# Patient Record
Sex: Male | Born: 1995 | Race: White | Hispanic: No | Marital: Single
Health system: Southern US, Community
[De-identification: ages and names within clinical notes are randomized; demographics above are authoritative.]

## PROBLEM LIST (undated history)

## (undated) DIAGNOSIS — K219 Gastro-esophageal reflux disease without esophagitis: Secondary | ICD-10-CM

## (undated) DIAGNOSIS — R197 Diarrhea, unspecified: Secondary | ICD-10-CM

## (undated) DIAGNOSIS — R1110 Vomiting, unspecified: Secondary | ICD-10-CM

## (undated) DIAGNOSIS — F32A Depression, unspecified: Secondary | ICD-10-CM

## (undated) DIAGNOSIS — F329 Major depressive disorder, single episode, unspecified: Secondary | ICD-10-CM

## (undated) HISTORY — DX: Gastro-esophageal reflux disease without esophagitis: K21.9

## (undated) HISTORY — DX: Vomiting, unspecified: R11.10

## (undated) HISTORY — DX: Diarrhea, unspecified: R19.7

## (undated) HISTORY — PX: OTHER SURGICAL HISTORY: SHX169

---
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
## 2016-09-30 ENCOUNTER — Encounter (HOSPITAL_COMMUNITY): Payer: Self-pay | Admitting: *Deleted

## 2016-09-30 ENCOUNTER — Emergency Department (HOSPITAL_COMMUNITY)
Admission: EM | Admit: 2016-09-30 | Discharge: 2016-09-30 | Disposition: A | Discharge status: Home / Self Care | Payer: Medicaid Other | Attending: Emergency Medicine | Admitting: Emergency Medicine

## 2016-09-30 DIAGNOSIS — Z7722 Contact with and (suspected) exposure to environmental tobacco smoke (acute) (chronic): Secondary | ICD-10-CM | POA: Insufficient documentation

## 2016-09-30 DIAGNOSIS — R197 Diarrhea, unspecified: Secondary | ICD-10-CM | POA: Insufficient documentation

## 2016-09-30 DIAGNOSIS — R1013 Epigastric pain: Secondary | ICD-10-CM | POA: Diagnosis present

## 2016-09-30 DIAGNOSIS — K219 Gastro-esophageal reflux disease without esophagitis: Secondary | ICD-10-CM | POA: Diagnosis not present

## 2016-09-30 DIAGNOSIS — Z791 Long term (current) use of non-steroidal anti-inflammatories (NSAID): Secondary | ICD-10-CM | POA: Diagnosis not present

## 2016-09-30 HISTORY — DX: Depression, unspecified: F32.A

## 2016-09-30 HISTORY — DX: Major depressive disorder, single episode, unspecified: F32.9

## 2016-09-30 MED ORDER — ONDANSETRON 4 MG PO TBDP
4.0000 mg | ORAL_TABLET | Freq: Once | ORAL | Status: AC
  Administered 2016-09-30: 4 mg via ORAL
  Filled 2016-09-30: qty 1

## 2016-09-30 MED ORDER — LOPERAMIDE HCL 2 MG PO CAPS
2.0000 mg | ORAL_CAPSULE | Freq: Once | ORAL | Status: AC
  Administered 2016-09-30: 2 mg via ORAL
  Filled 2016-09-30: qty 1

## 2016-09-30 MED ORDER — FAMOTIDINE 20 MG PO TABS
20.0000 mg | ORAL_TABLET | Freq: Once | ORAL | Status: AC
  Administered 2016-09-30: 20 mg via ORAL
  Filled 2016-09-30: qty 1

## 2016-09-30 MED ORDER — ONDANSETRON 4 MG PREPACK (~~LOC~~): 1.0000 | ORAL_TABLET | Freq: Three times a day (TID) | ORAL | 0 refills | Status: DC | PRN

## 2016-09-30 MED ORDER — RANITIDINE HCL 150 MG PO TABS: 150.0000 mg | ORAL_TABLET | Freq: Two times a day (BID) | ORAL | 1 refills | Status: AC

## 2016-09-30 NOTE — Discharge Instructions (Signed)
Continue to take zantac. You may take 1 tablet (150mg ) twice per day. Take nausea medication as needed.

## 2016-09-30 NOTE — ED Triage Notes (Signed)
Pt c/o "chest pressure", "belching", headache, lightheadedness, stomach cramps, bloating, nausea diarrhea, and SOB x 2 days. Denies vomiting. Pt also reports near syncope x 2 today.

## 2016-09-30 NOTE — ED Provider Notes (Signed)
[facility] Provider Note   CSN: [id number] Arrival date & time: 09/30/16  1538     History   Chief Complaint Chief Complaint  Patient presents with  . Abdominal Pain    HPI Michael Todd is a 20 y.o. male.  HPI   20 year old male with abdominal pain. Epigastric. Onset about a day and half ago. Burning in quality. Sometimes she'll sit up to his chest. The last day he is also reporting diarrhea and crampy left-sided abdominal pain. No fevers or chills. No sick contacts. No blood in stool. No vomiting. No urinary complaints.  Past Medical History:  Diagnosis Date  . Abdominal pain   . Acid reflux   . Depression   . Diarrhea   . Vomiting     Patient Active Problem List   Diagnosis Date Noted  . Recent weight loss 02/22/2014  . Problem with school attendance 02/22/2014  . Poor dentition 02/22/2014  . Diarrhea 04/07/2011  . Vomiting   . Generalized abdominal pain     Past Surgical History:  Procedure Laterality Date  . right foot surgery     tendon       Home Medications    Prior to Admission medications   Medication Sig Start Date End Date Taking? Authorizing Provider  bismuth subsalicylate (PEPTO BISMOL) 262 MG chewable tablet Chew 524 mg by mouth as needed for indigestion.    Historical Provider, MD  cetirizine (ZYRTEC) 10 MG tablet Take 10 mg by mouth daily.    Historical Provider, MD  famotidine (PEPCID) 40 MG tablet Take 40 mg by mouth daily.    Historical Provider, MD  fluticasone (FLONASE) 50 MCG/ACT nasal spray Place 2 sprays into the nose daily.    Historical Provider, MD  loperamide (IMODIUM A-D) 2 MG tablet Take 2 mg by mouth 4 (four) times daily as needed for diarrhea or loose stools.    Historical Provider, MD  ondansetron (ZOFRAN-ODT) 4 MG disintegrating tablet Take 4 mg by mouth every 8 (eight) hours as needed for nausea.    Historical Provider, MD    Family History No family history on file.  Social History Social History    Substance Use Topics  . Smoking status: Passive Smoke Exposure - Never Smoker  . Smokeless tobacco: Never Used  . Alcohol use No     Allergies   Patient has no known allergies.   Review of Systems Review of Systems  All systems reviewed and negative, other than as noted in HPI.   Physical Exam Updated Vital Signs BP 131/90 (BP Location: Left Arm)   Pulse 98   Temp 98 F (36.7 C) (Oral)   Resp 18   Ht 5\' 11"  (1.803 m)   Wt 170 lb (77.1 kg)   SpO2 100%   BMI 23.71 kg/m   Physical Exam  Constitutional: He appears well-developed and well-nourished. No distress.  HENT:  Head: Normocephalic and atraumatic.  Eyes: Conjunctivae are normal. Right eye exhibits no discharge. Left eye exhibits no discharge.  Neck: Neck supple.  Cardiovascular: Normal rate, regular rhythm and normal heart sounds.  Exam reveals no gallop and no friction rub.   No murmur heard. Pulmonary/Chest: Effort normal and breath sounds normal. No respiratory distress.  Abdominal: Soft. He exhibits no distension. There is no tenderness.  Interval epigastric tenderness without rebound or guarding. No distention.  Musculoskeletal: He exhibits no edema or tenderness.  Neurological: He is alert.  Skin: Skin is warm and dry.  Psychiatric: He has  a normal mood and affect. His behavior is normal. Thought content normal.  Nursing note and vitals reviewed.    ED Treatments / Results  Labs (all labs ordered are listed, but only abnormal results are displayed) Labs Reviewed - No data to display  EKG  EKG Interpretation None       Radiology No results found.  Procedures Procedures (including critical care time)  Medications Ordered in ED Medications - No data to display   Initial Impression / Assessment and Plan / ED Course  I have reviewed the triage vital signs and the nursing notes.  Pertinent labs & imaging results that were available during my care of the patient were reviewed by me and  considered in my medical decision making (see chart for details).  Clinical Course    20 year old male with abdominal pain. Epigastric. Reassuring exam. Suspect that this may be reflux. We'll try an H2 blocker. Return precautions discussed.  Final Clinical Impressions(s) / ED Diagnoses   Final diagnoses:  Gastroesophageal reflux disease, esophagitis presence not specified  Diarrhea, unspecified type    New Prescriptions New Prescriptions   No medications on file     Raeford RazorStephen Deeric Cruise, MD 10/08/16 1653

## 2016-09-30 NOTE — ED Notes (Signed)
Called for triage x1. No answer in waiting room.

## 2016-10-01 ENCOUNTER — Emergency Department (HOSPITAL_COMMUNITY): Payer: Medicaid Other

## 2016-10-01 ENCOUNTER — Encounter (HOSPITAL_COMMUNITY): Payer: Self-pay | Admitting: Emergency Medicine

## 2016-10-01 ENCOUNTER — Emergency Department (HOSPITAL_COMMUNITY)
Admission: EM | Admit: 2016-10-01 | Discharge: 2016-10-01 | Disposition: A | Discharge status: Home / Self Care | Payer: Medicaid Other | Attending: Emergency Medicine | Admitting: Emergency Medicine

## 2016-10-01 DIAGNOSIS — R112 Nausea with vomiting, unspecified: Secondary | ICD-10-CM

## 2016-10-01 DIAGNOSIS — R109 Unspecified abdominal pain: Secondary | ICD-10-CM

## 2016-10-01 DIAGNOSIS — Z79899 Other long term (current) drug therapy: Secondary | ICD-10-CM | POA: Diagnosis not present

## 2016-10-01 DIAGNOSIS — Z791 Long term (current) use of non-steroidal anti-inflammatories (NSAID): Secondary | ICD-10-CM | POA: Diagnosis not present

## 2016-10-01 DIAGNOSIS — R1013 Epigastric pain: Secondary | ICD-10-CM | POA: Diagnosis not present

## 2016-10-01 DIAGNOSIS — Z7722 Contact with and (suspected) exposure to environmental tobacco smoke (acute) (chronic): Secondary | ICD-10-CM | POA: Insufficient documentation

## 2016-10-01 DIAGNOSIS — R197 Diarrhea, unspecified: Secondary | ICD-10-CM | POA: Insufficient documentation

## 2016-10-01 DIAGNOSIS — G8929 Other chronic pain: Secondary | ICD-10-CM | POA: Insufficient documentation

## 2016-10-01 DIAGNOSIS — R1010 Upper abdominal pain, unspecified: Secondary | ICD-10-CM | POA: Diagnosis present

## 2016-10-01 LAB — COMPREHENSIVE METABOLIC PANEL
ALK PHOS: 85 U/L (ref 38–126)
ALT: 14 U/L — AB (ref 17–63)
AST: 18 U/L (ref 15–41)
Albumin: 5.2 g/dL — ABNORMAL HIGH (ref 3.5–5.0)
Anion gap: 11 (ref 5–15)
BILIRUBIN TOTAL: 0.4 mg/dL (ref 0.3–1.2)
BUN: 8 mg/dL (ref 6–20)
CALCIUM: 9.6 mg/dL (ref 8.9–10.3)
CO2: 25 mmol/L (ref 22–32)
CREATININE: 1.01 mg/dL (ref 0.61–1.24)
Chloride: 101 mmol/L (ref 101–111)
Glucose, Bld: 107 mg/dL — ABNORMAL HIGH (ref 65–99)
Potassium: 3.7 mmol/L (ref 3.5–5.1)
Sodium: 137 mmol/L (ref 135–145)
TOTAL PROTEIN: 8.6 g/dL — AB (ref 6.5–8.1)

## 2016-10-01 LAB — CBC
HCT: 49.3 % (ref 39.0–52.0)
Hemoglobin: 16.9 g/dL (ref 13.0–17.0)
MCH: 29.1 pg (ref 26.0–34.0)
MCHC: 34.3 g/dL (ref 30.0–36.0)
MCV: 85 fL (ref 78.0–100.0)
PLATELETS: 258 10*3/uL (ref 150–400)
RBC: 5.8 MIL/uL (ref 4.22–5.81)
RDW: 12.5 % (ref 11.5–15.5)
WBC: 12.4 10*3/uL — AB (ref 4.0–10.5)

## 2016-10-01 LAB — URINALYSIS, ROUTINE W REFLEX MICROSCOPIC
GLUCOSE, UA: NEGATIVE mg/dL
KETONES UR: 40 mg/dL — AB
LEUKOCYTES UA: NEGATIVE
Nitrite: NEGATIVE
Specific Gravity, Urine: >1.03 — ABNORMAL HIGH (ref 1.005–1.030)
pH: 6 (ref 5.0–8.0)

## 2016-10-01 LAB — URINE MICROSCOPIC-ADD ON

## 2016-10-01 LAB — LIPASE, BLOOD: Lipase: 16 U/L (ref 11–51)

## 2016-10-01 MED ORDER — PANTOPRAZOLE SODIUM 20 MG PO TBEC: 20.0000 mg | DELAYED_RELEASE_TABLET | Freq: Every day | ORAL | 0 refills | Status: DC

## 2016-10-01 MED ORDER — FAMOTIDINE 20 MG PO TABS
40.0000 mg | ORAL_TABLET | Freq: Once | ORAL | Status: AC
  Administered 2016-10-01: 40 mg via ORAL
  Filled 2016-10-01: qty 2

## 2016-10-01 MED ORDER — DICYCLOMINE HCL 10 MG/ML IM SOLN
20.0000 mg | Freq: Once | INTRAMUSCULAR | Status: AC
  Administered 2016-10-01: 20 mg via INTRAMUSCULAR
  Filled 2016-10-01: qty 2

## 2016-10-01 MED ORDER — DICYCLOMINE HCL 20 MG PO TABS: 20.0000 mg | ORAL_TABLET | Freq: Four times a day (QID) | ORAL | 0 refills | Status: DC | PRN

## 2016-10-01 MED ORDER — GI COCKTAIL ~~LOC~~
30.0000 mL | Freq: Once | ORAL | Status: AC
  Administered 2016-10-01: 30 mL via ORAL
  Filled 2016-10-01: qty 30

## 2016-10-01 MED ORDER — METOCLOPRAMIDE HCL 5 MG/ML IJ SOLN
10.0000 mg | Freq: Once | INTRAMUSCULAR | Status: AC
  Administered 2016-10-01: 10 mg via INTRAMUSCULAR
  Filled 2016-10-01: qty 2

## 2016-10-01 NOTE — ED Provider Notes (Signed)
[facility] Provider Note   CSN: [id number] Arrival date & time: 10/01/16  1533     History   Chief Complaint Chief Complaint  Patient presents with  . Abdominal Pain    HPI Michael Todd is a 20 y.o. male.  HPI  Pt was seen at 1949.  Per pt, c/o gradual onset and persistence of constant acute flair of his chronic upper abd "pain" for the past several days.  Has been associated with his chronic/recurrent symptoms of N/V/D.  Describes the abd pain as "sharp." Denies fevers, no back pain, no rash, no CP/SOB, no black or blood in stools or emesis. The symptoms have been associated with no other complaints. The patient has a significant history of similar symptoms previously, recently being evaluated for this complaint and multiple prior evals for same.  Pt states he has not been back to his Peds GI MD "for a few years."     Past Medical History:  Diagnosis Date  . Abdominal pain   . Acid reflux   . Depression   . Diarrhea   . Vomiting     Patient Active Problem List   Diagnosis Date Noted  . Recent weight loss 02/22/2014  . Problem with school attendance 02/22/2014  . Poor dentition 02/22/2014  . Diarrhea 04/07/2011  . Vomiting   . Generalized abdominal pain     Past Surgical History:  Procedure Laterality Date  . right foot surgery     tendon       Home Medications    Prior to Admission medications   Medication Sig Start Date End Date Taking? Authorizing Provider  amoxicillin-clavulanate (AUGMENTIN) 875-125 MG tablet Take 1 tablet by mouth 2 (two) times daily.   Yes Historical Provider, MD  bismuth subsalicylate (PEPTO BISMOL) 262 MG chewable tablet Chew 524 mg by mouth as needed for indigestion.   Yes Historical Provider, MD  cetirizine (ZYRTEC) 10 MG tablet Take 10 mg by mouth daily.   Yes Historical Provider, MD  FLUoxetine (PROZAC) 20 MG tablet Take 60 mg by mouth daily.   Yes Historical Provider, MD  ibuprofen (ADVIL,MOTRIN) 200 MG tablet Take  800 mg by mouth every 6 (six) hours as needed for moderate pain.   Yes Historical Provider, MD  ondansetron (ZOFRAN) 4 mg TABS tablet Take 4 tablets by mouth every 8 (eight) hours as needed. 09/30/16  Yes Raeford Razor, MD  ranitidine (ZANTAC) 150 MG tablet Take 1 tablet (150 mg total) by mouth 2 (two) times daily. 09/30/16  Yes Raeford Razor, MD    Family History History reviewed. No pertinent family history.  Social History Social History  Substance Use Topics  . Smoking status: Passive Smoke Exposure - Never Smoker  . Smokeless tobacco: Never Used  . Alcohol use No     Allergies   Patient has no known allergies.   Review of Systems Review of Systems ROS: Statement: All systems negative except as marked or noted in the HPI; Constitutional: Negative for fever and chills. ; ; Eyes: Negative for eye pain, redness and discharge. ; ; ENMT: Negative for ear pain, hoarseness, nasal congestion, sinus pressure and sore throat. ; ; Cardiovascular: Negative for chest pain, palpitations, diaphoresis, dyspnea and peripheral edema. ; ; Respiratory: Negative for cough, wheezing and stridor. ; ; Gastrointestinal: +N/V/D, abd pain. Negative for blood in stool, hematemesis, jaundice and rectal bleeding. . ; ; Genitourinary: Negative for dysuria, flank pain and hematuria. ; ; Musculoskeletal: Negative for back pain and neck  pain. Negative for swelling and trauma.; ; Skin: Negative for pruritus, rash, abrasions, blisters, bruising and skin lesion.; ; Neuro: Negative for headache, lightheadedness and neck stiffness. Negative for weakness, altered level of consciousness, altered mental status, extremity weakness, paresthesias, involuntary movement, seizure and syncope.       Physical Exam Updated Vital Signs BP 131/79 (BP Location: Left Arm)   Pulse 80   Temp 97.8 F (36.6 C) (Oral)   Resp 18   Ht 5\' 11"  (1.803 m)   Wt 170 lb (77.1 kg)   SpO2 97%   BMI 23.71 kg/m   Physical Exam 1955: Physical  examination:  Nursing notes reviewed; Vital signs and O2 SAT reviewed;  Constitutional: Well developed, Well nourished, Well hydrated, In no acute distress; Head:  Normocephalic, atraumatic; Eyes: EOMI, PERRL, No scleral icterus; ENMT: Mouth and pharynx normal, Mucous membranes moist. Poor dentition.; Neck: Supple, Full range of motion, No lymphadenopathy; Cardiovascular: Regular rate and rhythm, No murmur, rub, or gallop; Respiratory: Breath sounds clear & equal bilaterally, No rales, rhonchi, wheezes.  Speaking full sentences with ease, Normal respiratory effort/excursion; Chest: Nontender, Movement normal; Abdomen: Soft, +mid-epigastric and LUQ tenderness to palp. No rebound or guarding. Nondistended, Normal bowel sounds; Genitourinary: No CVA tenderness; Extremities: Pulses normal, No tenderness, No edema, No calf edema or asymmetry.; Neuro: AA&Ox3, Major CN grossly intact.  Speech clear. No gross focal motor or sensory deficits in extremities. Climbs on and off stretcher easily by himself. Gait steady.; Skin: Color normal, Warm, Dry.   ED Treatments / Results  Labs (all labs ordered are listed, but only abnormal results are displayed)   EKG  EKG Interpretation None       Radiology   Procedures Procedures (including critical care time)  Medications Ordered in ED Medications  dicyclomine (BENTYL) injection 20 mg (20 mg Intramuscular Given 10/01/16 2003)  gi cocktail (Maalox,Lidocaine,Donnatal) (30 mLs Oral Given 10/01/16 2000)  famotidine (PEPCID) tablet 40 mg (40 mg Oral Given 10/01/16 1959)  metoCLOPramide (REGLAN) injection 10 mg (10 mg Intramuscular Given 10/01/16 2002)     Initial Impression / Assessment and Plan / ED Course  I have reviewed the triage vital signs and the nursing notes.  Pertinent labs & imaging results that were available during my care of the patient were reviewed by me and considered in my medical decision making (see chart for details).  MDM Reviewed:  previous chart, nursing note and vitals Reviewed previous: labs, ultrasound and x-ray Interpretation: labs and x-ray    Results for orders placed or performed during the hospital encounter of 10/01/16  Lipase, blood  Result Value Ref Range   Lipase 16 11 - 51 U/L  Comprehensive metabolic panel  Result Value Ref Range   Sodium 137 135 - 145 mmol/L   Potassium 3.7 3.5 - 5.1 mmol/L   Chloride 101 101 - 111 mmol/L   CO2 25 22 - 32 mmol/L   Glucose, Bld 107 (H) 65 - 99 mg/dL   BUN 8 6 - 20 mg/dL   Creatinine, Ser 1.301.01 0.61 - 1.24 mg/dL   Calcium 9.6 8.9 - 86.510.3 mg/dL   Total Protein 8.6 (H) 6.5 - 8.1 g/dL   Albumin 5.2 (H) 3.5 - 5.0 g/dL   AST 18 15 - 41 U/L   ALT 14 (L) 17 - 63 U/L   Alkaline Phosphatase 85 38 - 126 U/L   Total Bilirubin 0.4 0.3 - 1.2 mg/dL   GFR calc non Af Amer >60 >60 mL/min   GFR  calc Af Amer >60 >60 mL/min   Anion gap 11 5 - 15  CBC  Result Value Ref Range   WBC 12.4 (H) 4.0 - 10.5 K/uL   RBC 5.80 4.22 - 5.81 MIL/uL   Hemoglobin 16.9 13.0 - 17.0 g/dL   HCT 96.049.3 45.439.0 - 09.852.0 %   MCV 85.0 78.0 - 100.0 fL   MCH 29.1 26.0 - 34.0 pg   MCHC 34.3 30.0 - 36.0 g/dL   RDW 11.912.5 14.711.5 - 82.915.5 %   Platelets 258 150 - 400 K/uL  Urinalysis, Routine w reflex microscopic  Result Value Ref Range   Color, Urine YELLOW YELLOW   APPearance CLEAR CLEAR   Specific Gravity, Urine >1.030 (H) 1.005 - 1.030   pH 6.0 5.0 - 8.0   Glucose, UA NEGATIVE NEGATIVE mg/dL   Hgb urine dipstick MODERATE (A) NEGATIVE   Bilirubin Urine SMALL (A) NEGATIVE   Ketones, ur 40 (A) NEGATIVE mg/dL   Protein, ur TRACE (A) NEGATIVE mg/dL   Nitrite NEGATIVE NEGATIVE   Leukocytes, UA NEGATIVE NEGATIVE  Urine microscopic-add on  Result Value Ref Range   Squamous Epithelial / LPF 0-5 (A) NONE SEEN   WBC, UA 6-30 0 - 5 WBC/hpf   RBC / HPF 0-5 0 - 5 RBC/hpf   Bacteria, UA FEW (A) NONE SEEN   Casts HYALINE CASTS (A) NEGATIVE   Dg Abd Acute W/chest Result Date: 10/01/2016 CLINICAL DATA:   Intermittent chest pain and epigastric pain with nausea and vomiting EXAM: DG ABDOMEN ACUTE W/ 1V CHEST COMPARISON:  08/23/2013 FINDINGS: There is no evidence of dilated bowel loops or free intraperitoneal air. No radiopaque calculi or other significant radiographic abnormality is seen. Heart size and mediastinal contours are within normal limits. Both lungs are clear. IMPRESSION: Negative abdominal radiographs.  No acute cardiopulmonary disease. Electronically Signed   By: Jasmine PangKim  Fujinaga M.D.   On: 10/01/2016 20:28    2100:  Pt's symptoms chronic/recurrent for the past 5 years, per EPIC chart review; question at that time if symptoms were IBS. Pt has not f/u with GI MD for the past 2 years. WBC count improved from yesterday's ED visit. Pt has tol PO well while in the ED without N/V. VSS. No clear indication for imaging study at this time. Tx symptomatically, f/u GI MD. Dx and testing d/w pt and family.  Questions answered.  Verb understanding, agreeable to d/c home with outpt f/u.     Final Clinical Impressions(s) / ED Diagnoses   Final diagnoses:  None    New Prescriptions New Prescriptions   No medications on file      Samuel JesterKathleen Jazzmine Kleiman, DO 10/05/16 2010

## 2016-10-01 NOTE — ED Notes (Signed)
ED Provider at bedside. 

## 2016-10-01 NOTE — Discharge Instructions (Signed)
Eat a bland diet, avoiding greasy, fatty, fried foods, as well as spicy and acidic foods or beverages.  Avoid eating within the hour or 2 before going to bed or laying down.  Also avoid teas, colas, coffee, chocolate, pepermint and spearment. May also take over the counter maalox/mylanta, as directed on packaging, as needed for discomfort.  Take the prescriptions as directed from your ED visit yesterday and today.  Call your regular medical doctor tomorrow to schedule a follow up appointment this week.  Call the GI doctor tomorrow to schedule a follow up appointment within the next week. Return to the Emergency Department immediately if worsening.

## 2016-10-01 NOTE — ED Triage Notes (Addendum)
Pt reports being seen yesterday in ED.  Pt having abdominal pain generalized, nausea and diarrhea with occasional blood in stool.  Pt alert and oriented.

## 2016-10-04 LAB — URINE CULTURE

## 2016-10-07 ENCOUNTER — Telehealth: Payer: Self-pay

## 2016-10-07 NOTE — Telephone Encounter (Signed)
Pt was seen in the ER and he is wanting to make an appointment with us. Please advise if he can been seen.

## 2016-10-08 NOTE — Telephone Encounter (Signed)
Previous GI was pediatric GI, patient is now [age]. Ok to schedule

## 2016-10-09 ENCOUNTER — Encounter: Payer: Self-pay | Admitting: Internal Medicine

## 2016-10-09 NOTE — Telephone Encounter (Signed)
OV made and letter mailed °

## 2016-10-20 ENCOUNTER — Encounter: Payer: Self-pay | Admitting: Nurse Practitioner

## 2016-10-20 ENCOUNTER — Ambulatory Visit (INDEPENDENT_AMBULATORY_CARE_PROVIDER_SITE_OTHER): Payer: Medicaid Other | Admitting: Nurse Practitioner

## 2016-10-20 VITALS — BP 143/91 | HR 85 | Temp 98.0°F | Ht 71.0 in | Wt 170.6 lb

## 2016-10-20 DIAGNOSIS — R1084 Generalized abdominal pain: Secondary | ICD-10-CM | POA: Diagnosis not present

## 2016-10-20 DIAGNOSIS — K219 Gastro-esophageal reflux disease without esophagitis: Secondary | ICD-10-CM | POA: Diagnosis not present

## 2016-10-20 DIAGNOSIS — K5900 Constipation, unspecified: Secondary | ICD-10-CM | POA: Diagnosis not present

## 2016-10-20 MED ORDER — LINACLOTIDE 72 MCG PO CAPS: 72.0000 ug | ORAL_CAPSULE | Freq: Every day | ORAL | 0 refills | Status: AC

## 2016-10-20 MED ORDER — PANTOPRAZOLE SODIUM 40 MG PO TBEC: 40.0000 mg | DELAYED_RELEASE_TABLET | Freq: Every day | ORAL | 3 refills | Status: AC

## 2016-10-20 NOTE — Assessment & Plan Note (Signed)
History of GERD, previously on Protonix. He has epigastric burning pain which radiates up into his chest and esophagus. Likely exacerbation of GERD off PPI. I will have him restart Protonix 40 mg daily. Return for follow-up in 6 weeks.

## 2016-10-20 NOTE — Progress Notes (Signed)
Primary Care Physician:  Cassell SmilesFUSCO,LAWRENCE J., MD Primary Gastroenterologist:  Dr. Jena Gaussourk  Chief Complaint  Patient presents with  . Abdominal Pain    cramps, feels urge to have BM-says not constipated  . Nausea  . Gastroesophageal Reflux    when lying flat  . Blood In Stools    started yesterday, bright red blood in stool  . Diarrhea    HPI:   Michael Todd is a 20 y.o. male who presents on referral from ER for abdominal pain and GERD. Per discussion with the nurse the patient has crampy abdominal pain with urge to have a bowel movement but unable to, GERD when lying flat, blood in his stools which started yesterday, diarrhea, nausea. ER notes reviewed.   He was initially seen in the emergency department 09/30/2016 for epigastric abdominal pain which began a day and a half prior with a burning sensation up into his chest. Also noted diarrhea and crampy left-sided abdominal pain, no sick contacts or fevers. No blood in his stool. Exam noted to be reassuring and suspected reflux. He was started on an H2 blocker.  He returned to the emergency department on 10/01/2016 complaining of gradual onset and persistence of constant flare of his chronic upper abdominal pain over the past several days associated with nausea vomiting and diarrhea. Pain described as sharp, no fevers, blood in his stools, black stools. Lipase normal, CMP essentially normal, CBC essentially normal other than white blood cell count of 12.4. Abdomen and chest x-ray negative abdominal radiographs, no acute cardiopulmonary disease. Symptoms noted be chronic and recurrent for the previous 5 year's per Epic chart review, possible IBS. He has not followed up with GI provider in 2 years. White blood cell count improved from yesterday, tolerating by mouth well and was treated symptomatically recommended follow-up with GI. His previous GI was a pediatric gastroenterologist.  Last saw pediatric gastroenterologist on 07/08/2011 which  noted abdominal pain and occasional loose bowel movement, good compliance with Nexium but noncompliant with fiber supplementation. Noted possible IBS with poor fiber intake, vomiting resolved with PPI therapy. Recommended continue Nexium 40 mg daily and increase fiber intake. Return for follow-up in 4 months, which was not completed.  Today he states his abdominal pain has been ongoing for about a month. He is not previously on PPI. Abdominal pain is lower abdomen then radiates "up a bit" and is described as cramping. Has a bowel movement about every few hours "if I can get anything out" and notes sensation of needing to have a bowel movement but unable to. Will have a productive bowel movement about 1-2 times out of 6 attempts a day which is noted to be clear liquid or blood. Has noted hematochezia yesterday. Denies melena. Does notes some epigastric pain/pressure which seems to have resolved a couple days ago. Admits some GERD symptoms. Denies fever, chills, unintentional weight loss. Denies chest pain, dyspnea, dizziness, lightheadedness, syncope, near syncope. Denies any other upper or lower GI symptoms.  States it seems like Bentyl helped some. Zantac has helped minimally. Took Ibuprofen prior to dental work and also "prn" dental pain (about one to two times a week.) Denies ASA powders.  Past Medical History:  Diagnosis Date  . Abdominal pain   . Acid reflux   . Depression   . Diarrhea   . GERD (gastroesophageal reflux disease)   . Vomiting     Past Surgical History:  Procedure Laterality Date  . right foot surgery  tendon    Current Outpatient Prescriptions  Medication Sig Dispense Refill  . cetirizine (ZYRTEC) 10 MG tablet Take 10 mg by mouth daily.    Marland Kitchen. FLUoxetine (PROZAC) 20 MG tablet Take 60 mg by mouth daily.    Marland Kitchen. ibuprofen (ADVIL,MOTRIN) 200 MG tablet Take 800 mg by mouth every 6 (six) hours as needed for moderate pain.    . ranitidine (ZANTAC) 150 MG tablet Take 1 tablet  (150 mg total) by mouth 2 (two) times daily. 60 tablet 1  . linaclotide (LINZESS) 72 MCG capsule Take 1 capsule (72 mcg total) by mouth daily before breakfast. 16 capsule 0  . pantoprazole (PROTONIX) 40 MG tablet Take 1 tablet (40 mg total) by mouth daily. 90 tablet 3   No current facility-administered medications for this visit.     Allergies as of 10/20/2016  . (No Known Allergies)    No family history on file.  Social History   Social History  . Marital status: Single    Spouse name: N/A  . Number of children: N/A  . Years of education: N/A   Occupational History  . Not on file.   Social History Main Topics  . Smoking status: Passive Smoke Exposure - Never Smoker  . Smokeless tobacco: Never Used  . Alcohol use No  . Drug use: No  . Sexual activity: Not on file   Other Topics Concern  . Not on file   Social History Narrative   Starting 8th grade    Review of Systems: General: Negative for anorexia, weight loss, fever, chills, fatigue, weakness. ENT: Negative for hoarseness, difficulty swallowing. CV: Negative for chest pain, angina, palpitations, peripheral edema.  Respiratory: Negative for dyspnea at rest, cough, sputum, wheezing.  GI: See history of present illness. MS: Negative for joint pain, low back pain.  Derm: Negative for rash or itching.  Endo: Negative for unusual weight change.  Heme: Negative for bruising or bleeding. Allergy: Negative for rash or hives.    Physical Exam: BP (!) 143/91   Pulse 85   Temp 98 F (36.7 C) (Oral)   Ht 5\' 11"  (1.803 m)   Wt 170 lb 9.6 oz (77.4 kg)   BMI 23.79 kg/m  General:   Alert and oriented. Pleasant and cooperative. Well-nourished and well-developed. Noted poor personal hygiene. Head:  Normocephalic and atraumatic. Eyes:  Without icterus, sclera clear and conjunctiva pink.  Ears:  Normal auditory acuity. Mouth: Noted very poor dentition. Cardiovascular:  S1, S2 present without murmurs appreciated.  Extremities without clubbing or edema. Respiratory:  Clear to auscultation bilaterally. No wheezes, rales, or rhonchi. No distress.  Gastrointestinal:  +BS, soft, and non-distended. Mild lower abdominal TTP and epigastric TTP. No HSM noted. No guarding or rebound. No masses appreciated.  Rectal:  Deferred  Musculoskalatal:  Symmetrical without gross deformities. Neurologic:  Alert and oriented x4;  grossly normal neurologically. Psych:  Alert and cooperative. Normal mood and affect. Heme/Lymph/Immune: No excessive bruising noted.    10/20/2016 11:56 AM   Disclaimer: This note was dictated with voice recognition software. Similar sounding words can inadvertently be transcribed and may not be corrected upon review.

## 2016-10-20 NOTE — Progress Notes (Signed)
cc'ed to pcp °

## 2016-10-20 NOTE — Assessment & Plan Note (Signed)
The patient has lower abdominal pain as well as epigastric abdominal pain. Likely contributed in factors uncontrolled GERD and irritable bowel syndrome/constipation. See further evaluation and workup for those to items above. Return for follow-up in 6 weeks. No red flag/warning signs or symptoms, no reason to feel he needs emergent workup or CT imaging at this time.

## 2016-10-20 NOTE — Patient Instructions (Signed)
1. I have sent a prescription for Protonix 40 mg to your pharmacy. Take 1 pill, once a day, 30 minutes before your first meal the day. 2. I'm giving you samples of Linzess 72 g pills. Take 1 pill, once a day, on an empty stomach. I will give you enough samples for 2 weeks. 3. Call us in 1-2 weeks and let us know if Linzess is helping her bowel movements and how you're abdominal pain is doing. 4. Return for follow-up in 6 weeks.

## 2016-10-20 NOTE — Assessment & Plan Note (Signed)
The patient seems to be constipated where he will try to have a bowel movement multiple times a day and only have success in the last couple bowel movements. Noted rectal bleeding as of yesterday, likely due to straining. States stools are hard. Likely irritable bowel syndrome as initially indicated by pediatric gastroenterologists. Today I'll start him on Linzess 72 g once daily on an empty stomach to promote better bowel movements, I will provide him samples for 2 weeks. Request a progress report in 1-2 weeks related to effectiveness. Return for follow-up in 6 weeks.

## 2016-12-01 ENCOUNTER — Ambulatory Visit: Payer: Medicaid Other | Admitting: Nurse Practitioner

## 2016-12-01 ENCOUNTER — Telehealth: Payer: Self-pay | Admitting: Nurse Practitioner

## 2016-12-01 ENCOUNTER — Encounter: Payer: Self-pay | Admitting: Nurse Practitioner

## 2016-12-01 NOTE — Telephone Encounter (Signed)
Noted  

## 2016-12-01 NOTE — Progress Notes (Deleted)
Referring Provider: Elfredia NevinsFusco, Lawrence, MD Primary Care Physician:  Cassell SmilesFUSCO,LAWRENCE J., MD Primary GI:  Dr. Jena Gaussourk  No chief complaint on file.   HPI:   Michael Todd is a 21 y.o. male who presents for follow-up on abdominal pain, nausea, GERD. He was last seen in our office 10/20/2016. Has a history of seeing pediatric gastroenterology sometime ago. At the time of his last visit he stated abdominal pain has been ongoing for about a month in the lower abdomen and radiates up a bit described as cramping. Bowel movement every few hours "if I can get anything out" with sensation of incomplete emptying. Productive bowel movement about 1-2 times out of 6 times a day, noted to be clear liquid or blood. Rectal bleeding just started the day prior after significant period of straining. Some epigastric pain, some GERD symptoms. Feels Bentyl as helped, Zantac has helped minimally. Takes NSAIDs 1-2 times a week for dental pain. Started on Protonix 40 mg daily, gave samples of Linzess 72 g pills with request for progress report in 2 weeks, return for follow-up in 6 weeks.  Today he states   Past Medical History:  Diagnosis Date  . Abdominal pain   . Acid reflux   . Depression   . Diarrhea   . GERD (gastroesophageal reflux disease)   . Vomiting     Past Surgical History:  Procedure Laterality Date  . right foot surgery     tendon    Current Outpatient Prescriptions  Medication Sig Dispense Refill  . cetirizine (ZYRTEC) 10 MG tablet Take 10 mg by mouth daily.    Marland Kitchen. FLUoxetine (PROZAC) 20 MG tablet Take 60 mg by mouth daily.    Marland Kitchen. ibuprofen (ADVIL,MOTRIN) 200 MG tablet Take 800 mg by mouth every 6 (six) hours as needed for moderate pain.    Marland Kitchen. linaclotide (LINZESS) 72 MCG capsule Take 1 capsule (72 mcg total) by mouth daily before breakfast. 16 capsule 0  . pantoprazole (PROTONIX) 40 MG tablet Take 1 tablet (40 mg total) by mouth daily. 90 tablet 3  . ranitidine (ZANTAC) 150 MG tablet Take 1  tablet (150 mg total) by mouth 2 (two) times daily. 60 tablet 1   No current facility-administered medications for this visit.     Allergies as of 12/01/2016  . (No Known Allergies)    No family history on file.  Social History   Social History  . Marital status: Single    Spouse name: N/A  . Number of children: N/A  . Years of education: N/A   Social History Main Topics  . Smoking status: Passive Smoke Exposure - Never Smoker  . Smokeless tobacco: Never Used  . Alcohol use No  . Drug use: No  . Sexual activity: Not on file   Other Topics Concern  . Not on file   Social History Narrative   Starting 8th grade    Review of Systems: General: Negative for anorexia, weight loss, fever, chills, fatigue, weakness. Eyes: Negative for vision changes.  ENT: Negative for hoarseness, difficulty swallowing , nasal congestion. CV: Negative for chest pain, angina, palpitations, dyspnea on exertion, peripheral edema.  Respiratory: Negative for dyspnea at rest, dyspnea on exertion, cough, sputum, wheezing.  GI: See history of present illness. GU:  Negative for dysuria, hematuria, urinary incontinence, urinary frequency, nocturnal urination.  MS: Negative for joint pain, low back pain.  Derm: Negative for rash or itching.  Neuro: Negative for weakness, abnormal sensation, seizure, frequent headaches, memory  loss, confusion.  Psych: Negative for anxiety, depression, suicidal ideation, hallucinations.  Endo: Negative for unusual weight change.  Heme: Negative for bruising or bleeding. Allergy: Negative for rash or hives.   Physical Exam: There were no vitals taken for this visit. General:   Alert and oriented. Pleasant and cooperative. Well-nourished and well-developed.  Head:  Normocephalic and atraumatic. Eyes:  Without icterus, sclera clear and conjunctiva pink.  Ears:  Normal auditory acuity. Mouth:  No deformity or lesions, oral mucosa pink.  Throat/Neck:  Supple, without  mass or thyromegaly. Cardiovascular:  S1, S2 present without murmurs appreciated. Normal pulses noted. Extremities without clubbing or edema. Respiratory:  Clear to auscultation bilaterally. No wheezes, rales, or rhonchi. No distress.  Gastrointestinal:  +BS, soft, non-tender and non-distended. No HSM noted. No guarding or rebound. No masses appreciated.  Rectal:  Deferred  Musculoskalatal:  Symmetrical without gross deformities. Normal posture. Skin:  Intact without significant lesions or rashes. Neurologic:  Alert and oriented x4;  grossly normal neurologically. Psych:  Alert and cooperative. Normal mood and affect. Heme/Lymph/Immune: No significant cervical adenopathy. No excessive bruising noted.    12/01/2016 9:14 AM   Disclaimer: This note was dictated with voice recognition software. Similar sounding words can inadvertently be transcribed and may not be corrected upon review.

## 2016-12-01 NOTE — Telephone Encounter (Signed)
PATIENT WAS A NO SHOW AND LETTER SENT  °
